# Patient Record
Sex: Female | Born: 1960 | Race: White | Hispanic: No | Marital: Married | Smoking: Never smoker
Health system: Southern US, Community
[De-identification: ages and names within clinical notes are randomized; demographics above are authoritative.]

## PROBLEM LIST (undated history)

## (undated) DIAGNOSIS — D333 Benign neoplasm of cranial nerves: Secondary | ICD-10-CM

## (undated) DIAGNOSIS — E079 Disorder of thyroid, unspecified: Secondary | ICD-10-CM

## (undated) DIAGNOSIS — G44009 Cluster headache syndrome, unspecified, not intractable: Secondary | ICD-10-CM

## (undated) HISTORY — PX: BRAIN SURGERY: SHX531

## (undated) HISTORY — PX: ABDOMINAL HYSTERECTOMY: SHX81

## (undated) HISTORY — PX: THYROIDECTOMY: SHX17

## (undated) HISTORY — PX: CHOLECYSTECTOMY: SHX55

---

## 2000-04-30 ENCOUNTER — Other Ambulatory Visit: Admission: RE | Admit: 2000-04-30 | Discharge: 2000-04-30 | Payer: Self-pay | Admitting: *Deleted

## 2000-05-02 ENCOUNTER — Encounter (INDEPENDENT_AMBULATORY_CARE_PROVIDER_SITE_OTHER): Payer: Self-pay

## 2000-05-02 ENCOUNTER — Other Ambulatory Visit: Admission: RE | Admit: 2000-05-02 | Discharge: 2000-05-02 | Payer: Self-pay | Admitting: *Deleted

## 2004-07-26 ENCOUNTER — Ambulatory Visit (HOSPITAL_COMMUNITY): Admission: RE | Admit: 2004-07-26 | Discharge: 2004-07-26 | Payer: Self-pay | Admitting: Obstetrics & Gynecology

## 2004-09-20 ENCOUNTER — Encounter (INDEPENDENT_AMBULATORY_CARE_PROVIDER_SITE_OTHER): Payer: Self-pay | Admitting: *Deleted

## 2004-09-20 ENCOUNTER — Ambulatory Visit (HOSPITAL_COMMUNITY): Admission: RE | Admit: 2004-09-20 | Discharge: 2004-09-20 | Payer: Self-pay | Admitting: Obstetrics & Gynecology

## 2005-10-09 ENCOUNTER — Ambulatory Visit (HOSPITAL_COMMUNITY): Admission: RE | Admit: 2005-10-09 | Discharge: 2005-10-09 | Payer: Self-pay | Admitting: Obstetrics & Gynecology

## 2006-02-12 ENCOUNTER — Ambulatory Visit (HOSPITAL_COMMUNITY): Admission: RE | Admit: 2006-02-12 | Discharge: 2006-02-13 | Payer: Self-pay | Admitting: Obstetrics & Gynecology

## 2006-02-12 ENCOUNTER — Encounter (INDEPENDENT_AMBULATORY_CARE_PROVIDER_SITE_OTHER): Payer: Self-pay | Admitting: *Deleted

## 2006-10-23 ENCOUNTER — Encounter: Admission: RE | Admit: 2006-10-23 | Discharge: 2006-10-23 | Payer: Self-pay | Admitting: Family Medicine

## 2006-11-07 ENCOUNTER — Encounter: Admission: RE | Admit: 2006-11-07 | Discharge: 2006-11-07 | Payer: Self-pay | Admitting: Family Medicine

## 2006-11-20 ENCOUNTER — Encounter: Admission: RE | Admit: 2006-11-20 | Discharge: 2006-11-20 | Payer: Self-pay | Admitting: Family Medicine

## 2006-11-20 ENCOUNTER — Encounter (INDEPENDENT_AMBULATORY_CARE_PROVIDER_SITE_OTHER): Payer: Self-pay | Admitting: Interventional Radiology

## 2006-11-20 ENCOUNTER — Other Ambulatory Visit: Admission: RE | Admit: 2006-11-20 | Discharge: 2006-11-20 | Payer: Self-pay | Admitting: Interventional Radiology

## 2006-11-29 ENCOUNTER — Ambulatory Visit (HOSPITAL_COMMUNITY): Admission: RE | Admit: 2006-11-29 | Discharge: 2006-11-29 | Payer: Self-pay | Admitting: Family Medicine

## 2007-01-30 ENCOUNTER — Encounter: Admission: RE | Admit: 2007-01-30 | Discharge: 2007-01-30 | Payer: Self-pay | Admitting: Endocrinology

## 2007-04-17 ENCOUNTER — Encounter (HOSPITAL_BASED_OUTPATIENT_CLINIC_OR_DEPARTMENT_OTHER): Payer: Self-pay | Admitting: General Surgery

## 2007-04-17 ENCOUNTER — Ambulatory Visit (HOSPITAL_COMMUNITY): Admission: RE | Admit: 2007-04-17 | Discharge: 2007-04-18 | Payer: Self-pay | Admitting: General Surgery

## 2008-02-14 ENCOUNTER — Ambulatory Visit (HOSPITAL_COMMUNITY): Admission: RE | Admit: 2008-02-14 | Discharge: 2008-02-14 | Payer: Self-pay | Admitting: Family Medicine

## 2008-06-29 IMAGING — US US BIOPSY
1 series · 13 of 13 positions shown · non-contrast
Comparison: 1.

CLINICAL DATA: Multinodular thyroid goiter with focal area of relative to photopenia and left lower pole which corresponds to dominant nodule seen on thyroid ultrasound.  Fine needle aspiration has been requested.

[Series 1: us biopsy · 0.08mm/px · 13 acquisitions, 13 frames shown]
[im 1/13]
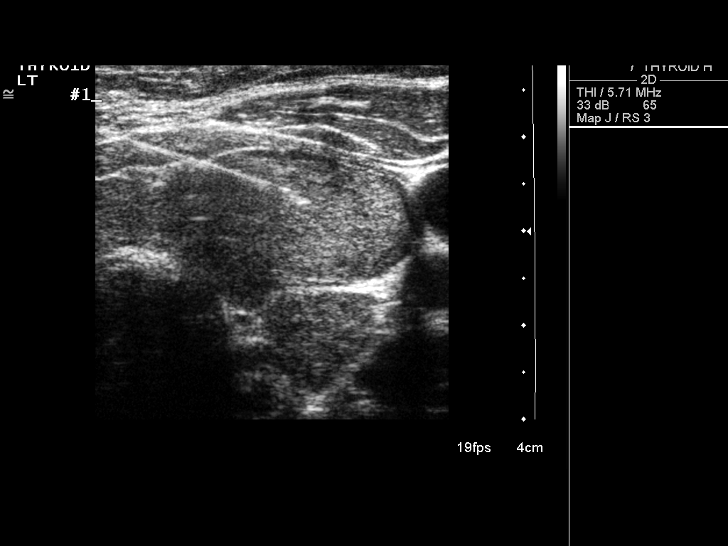
[im 2/13]
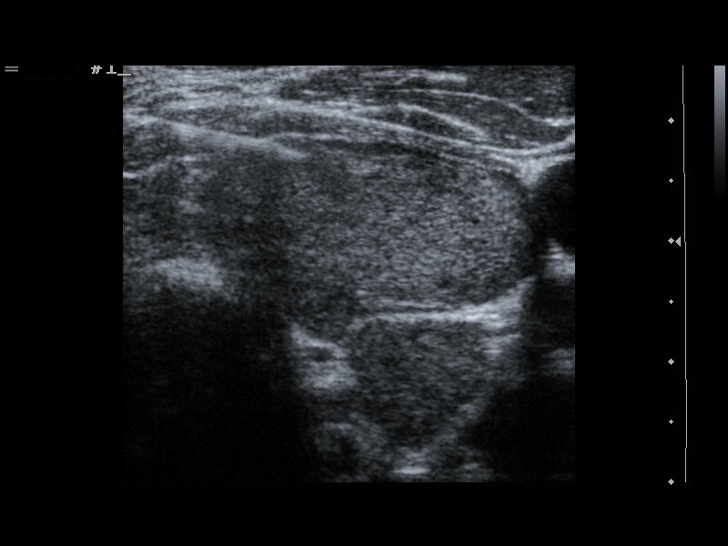
[im 3/13]
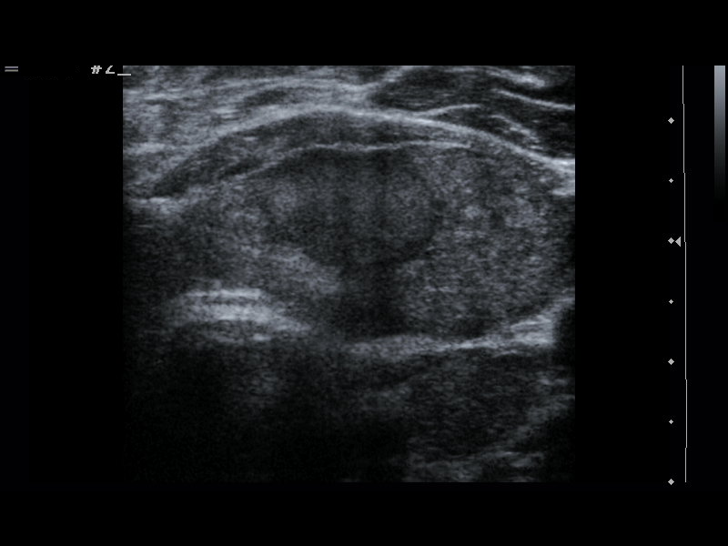
[im 4/13]
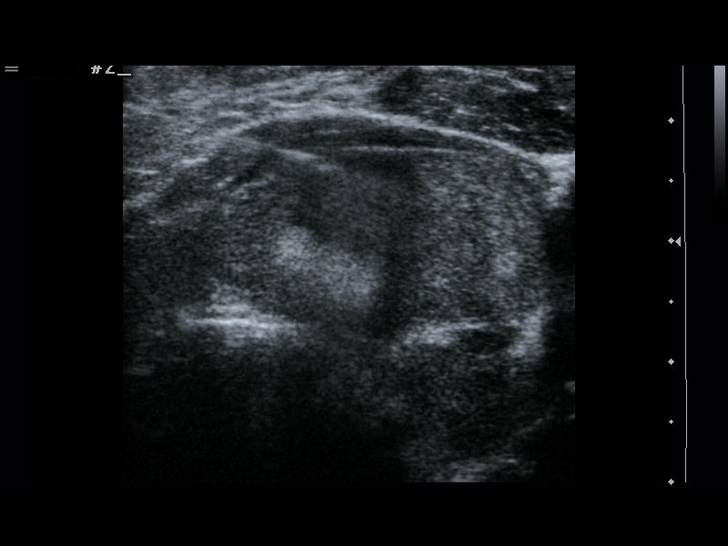
[im 5/13]
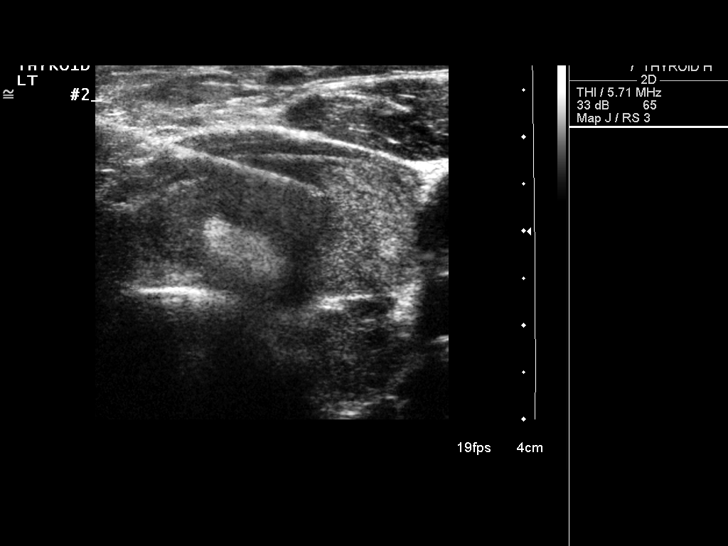
[im 6/13]
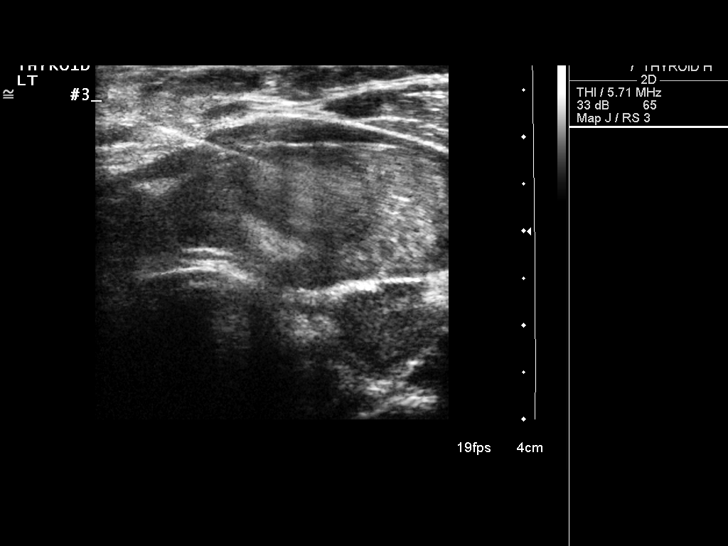
[im 7/13]
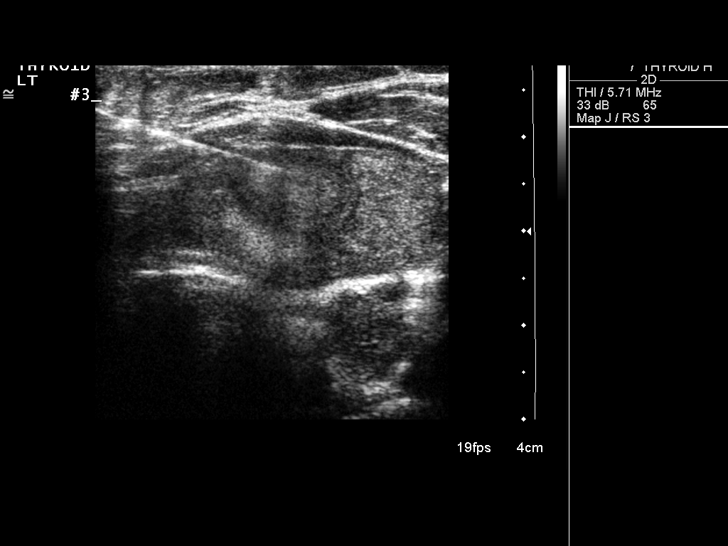
[im 8/13]
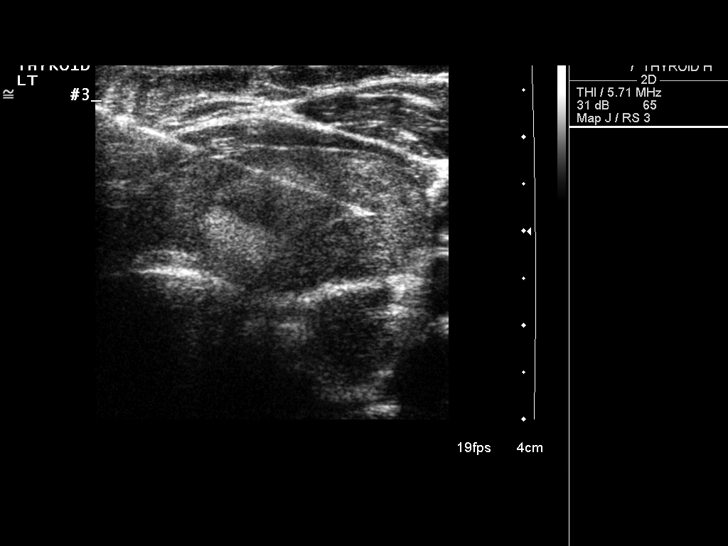
[im 9/13]
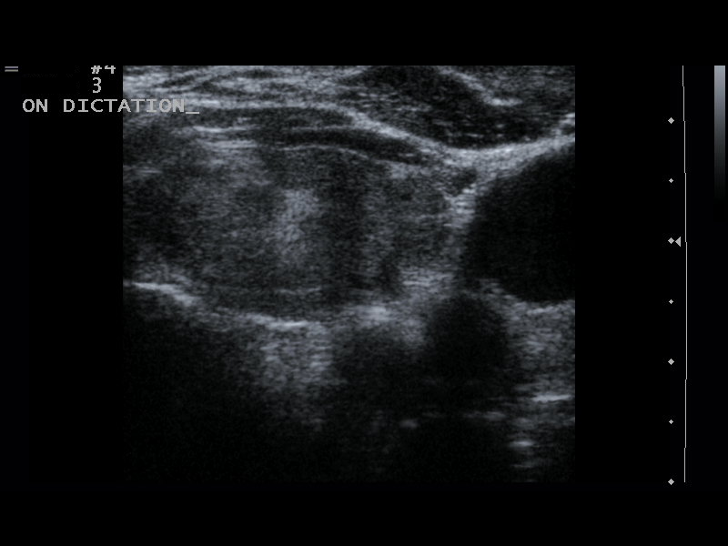
[im 10/13]
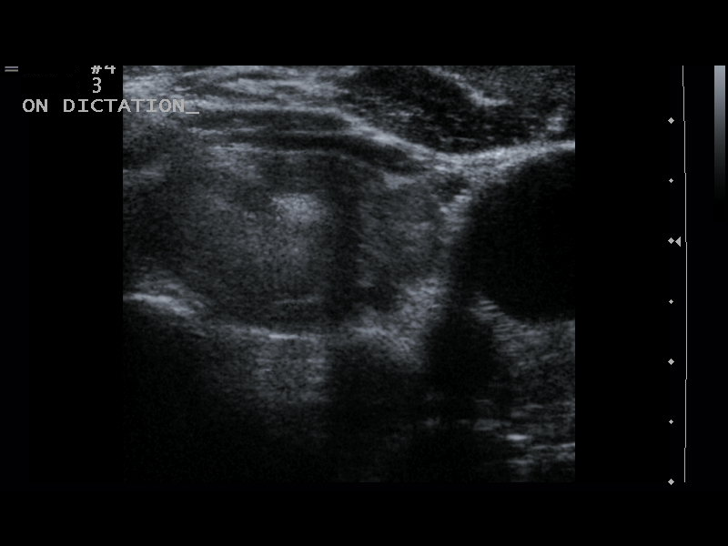
[im 11/13]
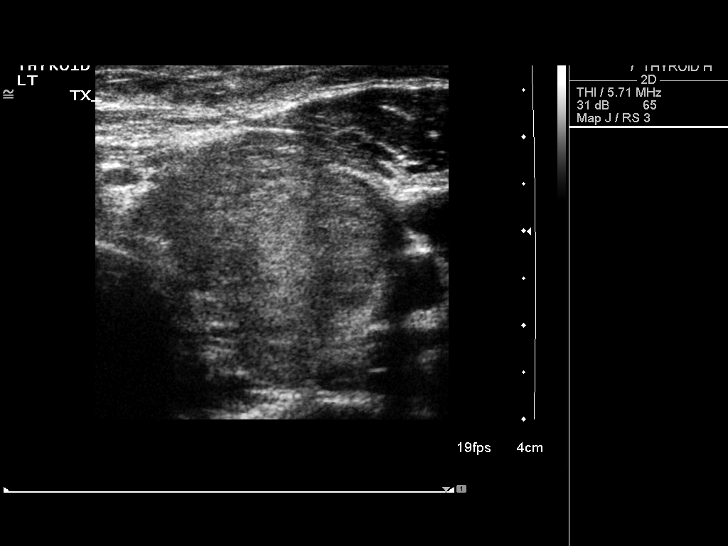
[im 12/13]
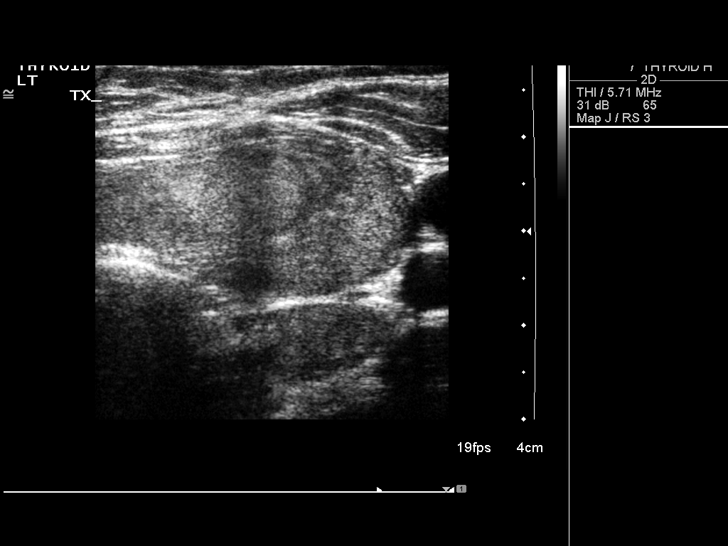
[im 13/13]
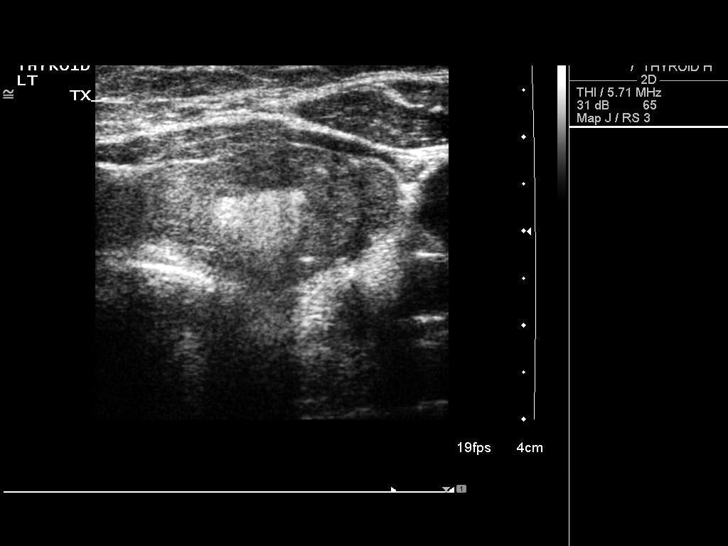

[13 of 13 positions shown; findings below may reference images not displayed]

Thyroid ultrasound, 10/23/06 ? [HOSPITAL].
2.  Nuclear medicine thyroid scan and thyroid radioiodine uptake, 11/08/06 ? [HOSPITAL].
ULTRASOUND-GUIDED FINE NEEDLE ASPIRATION, LEFT LOBE OF THYROID:
FINDINGS: The above procedure was thoroughly discussed with the patient and written informed consent was obtained. 
Ultrasound was then performed to localize and mark an adequate site for the biopsy.  The patient was then prepped and draped in a normal sterile fashion.  1% lidocaine was used for local anesthesia.  Within the left lower aspect of the thyroid, there appeared to be multiple nodules and therefore, I elected to biopsy all three areas within the left lobe of the thyroid.  A total of four passes (two were performed on one nodule)were made using a 25 gauge hypodermic needle.  Ultrasound confirmed placement of the exact location of the needle in relationship to the multiple nodules present.  These areas were also specifically marked 1, 2, and 3 on the slides sent to Pathology for further evaluation.  
Post procedure imaging demonstrated no hematoma or immediate complication.  The patient tolerated the procedure well.
IMPRESSION: Successful ultrasound-guided fine needle aspiration of several areas within the inferior aspect of the left lobe of the thyroid.  Please see full dictation above.  Final pathology pending.

## 2010-07-12 NOTE — Op Note (Signed)
Deanna Nichols, Deanna Nichols                ACCOUNT NO.:  [id number]   MEDICAL RECORD NO.:  [id number]          PATIENT TYPE:  OIB   LOCATION:  5125                         FACILITY:  [facility]   PHYSICIAN:  Leonie Man, M.D.   DATE OF BIRTH:  [birth year]   DATE OF PROCEDURE:  04/17/2007  DATE OF DISCHARGE:                               OPERATIVE REPORT   PREOPERATIVE DIAGNOSIS:  1. Multinodular goiter with compressive symptoms.  2. 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   POSTOPERATIVE DIAGNOSES:  1. Multinodular goiter with compressive symptoms.  2. 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   PROCEDURE:  Total thyroidectomy.   SURGEON:  Leonie Man, M.D.   ASSISTANT:  Anselm Pancoast. Zachery Dakins, M.D.   ANESTHESIA:  General.   BRIEF HISTORY:  Deanna Nichols is a 50 year old lady with compressive symptoms due to  an enlarging multinodular goiter despite maximum suppression therapy.  Her most recent scan showed a photo-opaque area in the left lower pole  which, on biopsy, shows Hurthle cells.  The right lobe measurement is  6.2 cm on ultrasound and the left lobe is 5.7 cm.  The patient comes to  the operating room after the risks and potential benefits of surgery are  fully discussed including permanent hypoparathyroidism as well as  recurrent laryngeal nerve injury.  She understands and gives her consent  for surgery.   PROCEDURE IN DETAIL:  The patient was positioned supinely with the neck  mildly hyperextended and the neck is then prepped and draped to be  included in the sterile operative field.  A collar incision is carried  down approximately two fingerbreadths above the sternal notch and  deepened through the skin, subcutaneous tissue and across the platysma  muscles.  A superior myocutaneous flap was raised up the thyroid  cartilage and inferiorly down to the sternal notch.  Strap muscles are  opened in their midline and carried down to the thyroid and dissection  was  carried down on the left side, carrying the strap muscles laterally  and mobilizing the thyroid from the neck.  Dissection was then carried  up into the region of the upper pole.  The upper pole vessels are  isolated and secured with 2-0 silk ties, clipped and transected.  The  upper pole is freed and the thyroid is then freed down to the lower  pole.  Both the upper and lower parathyroid glands on the left side are  identified as well as the recurrent laryngeal nerve.  These structures  were protected throughout the course of dissection.  The thyroid was  dissected free from the neck carrying the dissection from lateral to  medial.  At the medial aspect of the neck, the thyroid isthmus was  dissected free from the trachea and carried over toward the left side  just left of the midline.  The thyroid vessels were also transected  using the harmonic scalpel.  Attention was then turned to the right side  of the neck where, similarly, the strap muscles were retracted laterally  and dissection carried down, mobilizing the thyroid from  the from the  right lateral aspect of the neck.  The dissection was carried up into  the upper pole and allowed isolation of the upper pole vessels and these  were tied with 2-0 silk ties and secured with a medium clip and  transected with a harmonic scalpel.  The entire upper pole was then  freed. On the right side, there was significant dissection carried out  around the region of the ligament of Allyson Sabal because of a very large  tubercle of Zuckerkandl in this area.  The lower pole parathyroid gland  was identified and protected.  The recurrent laryngeal nerve was seen to  pass right through the ligament of Berry. Therefore, a small nodule of  thyroid gland was left over the ligament of Berry to protect the  recurrent laryngeal nerve.  The thyroid was then dissected free from the  trachea and forwarded for pathologic evaluation after a stitch had been  placed on  the right upper pole.  All areas of dissection in the neck  were checked for hemostasis.  Sponge, instrument, and sharp counts were  verified.  Gelfoam pledgets were placed in both sides of the neck for  additional hemostasis.  The midline strap muscle were then closed with  interrupted 3-0 Vicryl sutures.  The subcutaneous tissues and platysma  muscle were closed with interrupted 3-0 Vicryl sutures.  The skin was  closed with 4-0 Monocryl suture and then reinforced with Steri-Strips.  Sterile dressings were applied.  Visualization of the larynx and cords  with the assistance of anesthesia was then carried out. The cords were  noted to be quite parallel and toward the midline.  The patient was then  removed from the operating room to the recovery room in stable  condition.  She tolerated the procedure well.      Leonie Man, M.D.  Electronically Signed     PB/MEDQ  D:  04/17/2007  T:  04/17/2007  Job:  [id number]

## 2010-07-15 NOTE — Op Note (Signed)
NAMEJONESHA, Deanna Nichols                ACCOUNT NO.:  [id number]   MEDICAL RECORD NO.:  [id number]          PATIENT TYPE:  AMB   LOCATION:  SDC                           FACILITY:  [facility]   PHYSICIAN:  Genia Del, M.D.DATE OF BIRTH:  [birth year]   DATE OF PROCEDURE:  09/20/2004  DATE OF DISCHARGE:                                 OPERATIVE REPORT   PREOPERATIVE DIAGNOSIS:  Menometrorrhagia with two intrauterine lesions.   POSTOPERATIVE DIAGNOSIS:  Menometrorrhagia with two intrauterine lesions,  probable submucosal myoma and polyp.   OPERATION/PROCEDURE:  Hysteroscopy resection and dilatation and curettage.   SURGEON:  Genia Del, M.D.   ANESTHESIA:  Quillian Quince, M.D.   DESCRIPTION OF PROCEDURE:  Under general anesthesia with endotracheal  intubation, the patient is in the lithotomy position.  She is prepped with  Betadine on the suprapubic, vulvar and vaginal areas and draped as usual.  Vaginal exam reveals an anteverted uterus, normal volume.  No bile, no  adnexal masses.  The speculum is inserted in the vagina.  The anterior lip  of the cervix grasped with a tenaculum.  A paracervical block is done with  Nesacaine 1%, a total of 20 mL at 4 o'clock and 8 o'clock.  We then proceed  with dilatation of the cervix with Hegar dilator up to#35 without  difficulty.  We then insert the operative hysteroscope. The intrauterine  cavity is well-visualized.  The two ostia are well seen and pictures are  taken.  Two lesions are present from the posterior wall of the uterus.  One  appears to be a small polyp, the other one a small submucosal myoma. We use  a double-loop instrument to resect the two lesions and we sent them together  to pathology.  We then proceed with and endometrial curettage with a sharp  curet, systematically curetting all the intrauterine cavity and we send that  specimen separately to pathology.  We then visualize the intrauterine cavity  again.  No lesion is  present.  Hemostasis is good.  We take pictures after  the lesions were removed and remove all instruments.  Hemostasis is  adequate.  The fluid deficit was maximum 40 mL.  Estimated blood loss was  minimal.  No complications occurred and the patient was transferred to the  recovery room in good status.       ML/MEDQ  D:  09/20/2004  T:  09/20/2004  Job:  [id number]

## 2010-07-15 NOTE — Op Note (Signed)
Deanna Nichols, Deanna Nichols                ACCOUNT NO.:  [id number]   MEDICAL RECORD NO.:  [id number]          PATIENT TYPE:  OIB   LOCATION:  1306                         FACILITY:  [facility]   PHYSICIAN:  Genia Del, M.D.DATE OF BIRTH:  Jun 14, 1960   DATE OF PROCEDURE:  02/12/2006  DATE OF DISCHARGE:                               OPERATIVE REPORT   PREOPERATIVE DIAGNOSIS:  Menometrorrhagia with uterine myomas.   POSTOPERATIVE DIAGNOSIS:  1. Menometrorrhagia with uterine myomas.  2. Accidental cystotomy.   PROCEDURE:  A da Vinci assisted laparoscopic hysterectomy with bilateral  salpingo-oophorectomy, and repair of the bladder by laparoscopy assisted  with [employer].  Diagnostic cystoscopy.   SURGEON:  Jerral Ralph, M.D.   ASSISTANT:  Richardean Sale, M.D.   PROCEDURE:  Under general anesthesia with endotracheal intubation, the  patient is in the lithotomy position.  She is prepped with Betadine in  the abdominal suprapubic vulvar and vaginal areas; and draped as usual,  a bladder catheter is inserted.  The Kho ring with the Roomy are put in  place vaginally, as stitches done on the cervix to attach to the Kho  ring to improve stability of the instrument.  We then go abdominally.  We measure the camera incision at 22-cm above the symphysis pubis just  above the umbilicus.  We inject Marcaine 1/4 plain and then make a 1.5  cm incision at that level.  We opened layer by layer the aponeurosis is  opened with Mayo scissors and the parietal peritoneum is opened bluntly  with the finger.  We make a pursestring stitch of Vicryl #0 at the  aponeurosis.  The Hasson with the camera are inserted at that level.  The abdominopelvic cavities are inspected.  No adhesion is present with  the anterior wall.  We, therefore, made our measurements for the other  ports.  We insert three robotic trocars and one assistant trocars.  We  then docked the robot and put the instruments in place.  We used  the  fenestrated bipolar and the shear scissors as well as the Cobra probe  clamp in the third robotic arm.   We proceed with the hysterectomy.  We cauterize and section successively  the left round ligament.  We then verified the position of the left  ureter which is in normal anatomic position.  We cauterized and  sectioned the left infundibulopelvic ligament and then followed on the  left lateral side of the uterus until we reached the left uterine  artery.  We opened the visceral peritoneum anteriorly, and reclined the  bladder downward.  We identified the left uterine artery.  We cauterized  and sectioned it.  We then proceed exactly the same way on the right  side.  We then push a co-ring further and reclined the bladder further  in order to then proceed with the opening of the vaginal vault all  around the co-ring.  As we were reclining the bladder downward in the  midline, a small accidental cystotomy occurred.  It was recognized right  away, and repaired by laparoscopy with the assistance of  the bed [employer] robot.  We used Vicryl 2-0 and made about five interrupted  stitches.  At first the cystotomy was very very small, but as we were  repairing, it tore a little bit further so the final size of the  cystotomy was about 2.5 cm.  We then injected methylene blue inside the  bladder and noted no leakage at the level of the repaired cystotomy.   We then proceed the surgery after further reclining the bladder  downward, starting very very high on the uterus.  We opened the  circumferential incision with the point of the shear scissors all  around, and detached the uterus that way.  We passed above the  uterosacral ligaments so that support to the vagina would be preserved.  We then noted that the uterus was too voluminous to be brought out  through the vagina intact.  Therefore, a myomectomy was done at the  level of the largest myoma at the left fundus.  After removing the   myoma, we were able to remove the uterus; and then the myoma came out in  one piece through the vagina.  Both specimens were sent to pathology.  We then put the obturator in the vagina and closed the vaginal vault by  figure-of-eight Vicryl #0 sutures.  We verified hemostasis at all  levels; and it was adequate.  We irrigated and suctioned the  abdominopelvic cavity.   All instruments were removed the CO2 was evacuated.  The supraumbilical  incision was closed at the aponeurosis by attaching the suture.  We then  closed the skin with a subcuticular suture of Vicryl 4-0.  We closed the  assistant port with a Vicryl 4-0 as well and the other incisions were  closed with Dermabond.  We then proceeded with a diagnostic cystoscopy.  We used the 12-degree first.  The repaired cystostomy was well  visualized; and was intact.  After giving indigo carmine IV with the 70-  degree cystoscope we were able to visualize both ureters entering the  bladder; and the indigo carmine jets were seen.  We, therefore, removed  the cystoscope, emptied the bladder and put a Foley catheter in place to  straight drain. The estimated blood loss was 650 mL.  The counts of  sponges and instruments was complete.  The patient was brought to  recovery room in good stable status.  She will receive Septra DS b.i.d.  until the bladder catheter is removed.  The bladder catheter will be  removed and 5-7 days after the surgery.      Genia Del, M.D.  Electronically Signed     ML/MEDQ  D:  02/12/2006  T:  02/12/2006  Job:  [id number]

## 2010-07-18 ENCOUNTER — Other Ambulatory Visit (HOSPITAL_COMMUNITY): Payer: Self-pay | Admitting: Family Medicine

## 2010-07-18 DIAGNOSIS — Z1231 Encounter for screening mammogram for malignant neoplasm of breast: Secondary | ICD-10-CM

## 2010-07-20 ENCOUNTER — Ambulatory Visit (HOSPITAL_COMMUNITY)
Admission: RE | Admit: 2010-07-20 | Discharge: 2010-07-20 | Disposition: A | Discharge status: Home / Self Care | Payer: Self-pay | Source: Ambulatory Visit | Attending: Family Medicine | Admitting: Family Medicine

## 2010-07-20 DIAGNOSIS — Z1231 Encounter for screening mammogram for malignant neoplasm of breast: Secondary | ICD-10-CM

## 2010-11-18 LAB — CBC
HCT: 41.9
MCHC: 34.9
MCV: 87.8
Platelets: 327
WBC: 7.7

## 2010-11-18 LAB — DIFFERENTIAL
Basophils Relative: 1
Eosinophils Absolute: 0.2
Eosinophils Relative: 3
Lymphs Abs: 2.1
Monocytes Relative: 7
Neutrophils Relative %: 62

## 2010-11-18 LAB — PROTIME-INR
INR: 0.9
Prothrombin Time: 12.6

## 2010-11-18 LAB — BASIC METABOLIC PANEL
BUN: 10
CO2: 28
Chloride: 105
Creatinine, Ser: 0.88
Potassium: 4

## 2010-11-18 LAB — CALCIUM: Calcium: 8.4

## 2011-03-09 ENCOUNTER — Other Ambulatory Visit: Payer: Self-pay | Admitting: Gastroenterology

## 2012-10-31 ENCOUNTER — Other Ambulatory Visit: Payer: Self-pay | Admitting: Otolaryngology

## 2012-10-31 DIAGNOSIS — H9122 Sudden idiopathic hearing loss, left ear: Secondary | ICD-10-CM

## 2012-11-04 ENCOUNTER — Ambulatory Visit
Admission: RE | Admit: 2012-11-04 | Discharge: 2012-11-04 | Disposition: A | Discharge status: Home / Self Care | Payer: 59 | Source: Ambulatory Visit | Attending: Otolaryngology | Admitting: Otolaryngology

## 2012-11-04 DIAGNOSIS — H9122 Sudden idiopathic hearing loss, left ear: Secondary | ICD-10-CM

## 2012-11-04 MED ORDER — GADOBENATE DIMEGLUMINE 529 MG/ML IV SOLN: 20.0000 mL | Freq: Once | INTRAVENOUS | Status: AC | PRN

## 2012-11-05 ENCOUNTER — Other Ambulatory Visit: Payer: 59

## 2012-11-05 ENCOUNTER — Other Ambulatory Visit: Payer: Self-pay

## 2012-11-07 ENCOUNTER — Ambulatory Visit
Admission: RE | Admit: 2012-11-07 | Discharge: 2012-11-07 | Disposition: A | Discharge status: Home / Self Care | Payer: 59 | Source: Ambulatory Visit | Attending: Otolaryngology | Admitting: Otolaryngology

## 2012-11-07 MED ORDER — GADOBENATE DIMEGLUMINE 529 MG/ML IV SOLN
20.0000 mL | Freq: Once | INTRAVENOUS | Status: AC | PRN
  Administered 2012-11-07: 20 mL via INTRAVENOUS

## 2013-01-02 ENCOUNTER — Other Ambulatory Visit: Payer: Self-pay

## 2014-09-24 ENCOUNTER — Other Ambulatory Visit (HOSPITAL_COMMUNITY): Payer: Self-pay | Admitting: Family Medicine

## 2014-09-24 DIAGNOSIS — Z1231 Encounter for screening mammogram for malignant neoplasm of breast: Secondary | ICD-10-CM

## 2014-09-29 ENCOUNTER — Ambulatory Visit (HOSPITAL_COMMUNITY)
Admission: RE | Admit: 2014-09-29 | Discharge: 2014-09-29 | Disposition: A | Discharge status: Home / Self Care | Payer: 59 | Source: Ambulatory Visit | Attending: Family Medicine | Admitting: Family Medicine

## 2014-09-29 DIAGNOSIS — Z1231 Encounter for screening mammogram for malignant neoplasm of breast: Secondary | ICD-10-CM | POA: Diagnosis not present

## 2014-10-01 ENCOUNTER — Other Ambulatory Visit: Payer: Self-pay | Admitting: Family Medicine

## 2014-10-01 DIAGNOSIS — R928 Other abnormal and inconclusive findings on diagnostic imaging of breast: Secondary | ICD-10-CM

## 2014-10-07 ENCOUNTER — Other Ambulatory Visit: Payer: 59

## 2014-10-07 ENCOUNTER — Ambulatory Visit
Admission: RE | Admit: 2014-10-07 | Discharge: 2014-10-07 | Disposition: A | Discharge status: Home / Self Care | Payer: 59 | Source: Ambulatory Visit | Attending: Family Medicine | Admitting: Family Medicine

## 2014-10-07 DIAGNOSIS — R928 Other abnormal and inconclusive findings on diagnostic imaging of breast: Secondary | ICD-10-CM

## 2014-10-19 ENCOUNTER — Other Ambulatory Visit: Payer: Self-pay | Admitting: Gastroenterology

## 2015-02-27 ENCOUNTER — Encounter: Payer: Self-pay | Admitting: Emergency Medicine

## 2015-02-27 ENCOUNTER — Emergency Department (INDEPENDENT_AMBULATORY_CARE_PROVIDER_SITE_OTHER)
Admission: EM | Admit: 2015-02-27 | Discharge: 2015-02-27 | Disposition: A | Discharge status: Home / Self Care | Payer: 59 | Source: Home / Self Care | Attending: Family Medicine | Admitting: Family Medicine

## 2015-02-27 DIAGNOSIS — J069 Acute upper respiratory infection, unspecified: Secondary | ICD-10-CM

## 2015-02-27 DIAGNOSIS — J019 Acute sinusitis, unspecified: Secondary | ICD-10-CM | POA: Diagnosis not present

## 2015-02-27 DIAGNOSIS — H6692 Otitis media, unspecified, left ear: Secondary | ICD-10-CM | POA: Diagnosis not present

## 2015-02-27 HISTORY — DX: Disorder of thyroid, unspecified: E07.9

## 2015-02-27 HISTORY — DX: Benign neoplasm of cranial nerves: D33.3

## 2015-02-27 HISTORY — DX: Cluster headache syndrome, unspecified, not intractable: G44.009

## 2015-02-27 MED ORDER — AMOXICILLIN-POT CLAVULANATE 875-125 MG PO TABS: 1.0000 | ORAL_TABLET | Freq: Two times a day (BID) | ORAL | Status: AC

## 2015-02-27 NOTE — ED Provider Notes (Signed)
CSN: [id number]     Arrival date & time 02/27/15  1107 History   First MD Initiated Contact with Patient 02/27/15 1134     Chief Complaint  Patient presents with  . Nasal Congestion  . Otalgia   (Consider location/radiation/quality/duration/timing/severity/associated sxs/prior Treatment) HPI Pt is a 54yo female presenting to [facility] with c/o worsening sinus congestion with facial pain, bilateral ear pain that is worse in Left ear, and bilateral eye redness and itching that started 2 days ago. Left ear pain is 3/10 at this time.  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  She has also had subjective fever with hot and cold chills. Denies n/v/d.  She did take tylenol around 0830 this morning with minimal relief.  She has also been taking sudafed with minimal relief. No sick contacts or recent travel.   Past Medical History  Diagnosis Date  . 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 Thyroid disease   . Cluster headache    Past Surgical History  Procedure Laterality Date  . Thyroidectomy    . Cholecystectomy    . Abdominal hysterectomy    . Brain surgery     Family History  Problem Relation Age of Onset  . Hypertension Mother   . Stroke Father    Social History  Substance Use Topics  . Smoking status: Never Smoker   . Smokeless tobacco: None  . Alcohol Use: No   OB History    No data available     Review of Systems  Constitutional: Positive for fever, chills and fatigue.  HENT: Positive for congestion, ear pain, rhinorrhea, sinus pressure and sneezing. Negative for ear discharge, sore throat and voice change.   Eyes: Positive for pain, discharge ( watery), redness and itching. Negative for photophobia and visual disturbance.  Respiratory: Positive for cough. Negative for shortness of breath and wheezing.   Gastrointestinal: Negative for nausea, vomiting, abdominal pain and diarrhea.  Musculoskeletal: Negative  for myalgias, arthralgias, neck pain and neck stiffness.  Neurological: Positive for headaches. Negative for dizziness and light-headedness.    Allergies  Review of patient's allergies indicates no known allergies.  Home Medications   Prior to Admission medications   Medication Sig Start Date End Date Taking? Authorizing Provider  allopurinol (ZYLOPRIM) 300 MG tablet Take 300 mg by mouth daily.   Yes Historical Provider, MD  colchicine 0.6 MG tablet Take 0.6 mg by mouth daily.   Yes Historical Provider, MD  levothyroxine (SYNTHROID, LEVOTHROID) 100 MCG tablet Take 100 mcg by mouth daily before breakfast.   Yes Historical Provider, MD  SUMAtriptan (IMITREX) 100 MG tablet Take 100 mg by mouth every 2 (two) hours as needed for migraine. May repeat in 2 hours if headache persists or recurs.   Yes Historical Provider, MD  SUMAtriptan (IMITREX) 6 MG/0.5ML SOLN injection Inject 6 mg into the skin every 2 (two) hours as needed for migraine or headache. May repeat in 2 hours if headache persists or recurs.   Yes Historical Provider, MD  verapamil (CALAN) 80 MG tablet Take 80 mg by mouth 3 (three) times daily.   Yes Historical Provider, MD  amoxicillin-clavulanate (AUGMENTIN) 875-125 MG tablet Take 1 tablet by mouth 2 (two) times daily. For 10 days 02/27/15   Noland Fordyce, PA-C   Meds Ordered and Administered this Visit  Medications - No data to display  BP 138/84 mmHg  Pulse 122  Temp(Src)  97.7 F (36.5 C) (Oral)  Resp 16  Ht 5\' 6"  (1.676 m)  Wt 240 lb (108.863 kg)  BMI 38.76 kg/m2  SpO2 95% No data found.   Physical Exam  Constitutional: She appears well-developed and well-nourished. No distress.  Pt sitting on exam table, appears mildly fatigued, but no acute distress.  HENT:  Head: Normocephalic and atraumatic.  Right Ear: Hearing, tympanic membrane, external ear and ear canal normal.  Left Ear: Hearing, external ear and ear canal normal. No mastoid tenderness. Tympanic membrane is  erythematous and bulging.  Nose: Mucosal edema present. Right sinus exhibits maxillary sinus tenderness and frontal sinus tenderness. Left sinus exhibits maxillary sinus tenderness and frontal sinus tenderness.  Mouth/Throat: Uvula is midline, oropharynx is clear and moist and mucous membranes are normal.  Eyes: Conjunctivae are normal. No scleral icterus.  Neck: Normal range of motion. Neck supple.  Cardiovascular: Regular rhythm and normal heart sounds.  Tachycardia present.   Pulmonary/Chest: Effort normal and breath sounds normal. No respiratory distress. She has no wheezes. She has no rales. She exhibits no tenderness.  Abdominal: Soft. She exhibits no distension. There is no tenderness. There is no rebound.  Musculoskeletal: Normal range of motion.  Neurological: She is alert.  Skin: Skin is warm and dry. She is not diaphoretic.  Nursing note and vitals reviewed.   ED Course  Procedures (including critical care time)  Labs Review Labs Reviewed - No data to display  Imaging Review No results found.   MDM   1. Acute rhinosinusitis   2. Acute upper respiratory infection   3. Recurrent acute otitis media of left ear, unspecified otitis media type    Pt is a 54yo female c/o suddenly worsening sinus congestion and sinus pain with Left ear pain. Hx of tumor of Left middle ear.   Pt is tachycardic with HR of 122, but denies chest pain or SOB. Pt is afebrile per temp, however, pt is warm to touch, could be tachyardic from pain and not feeling well. No evidence of acute distress.  Will start pt on Augmentin to cover for bacterial cause given pt's hx of recent radiation for inner ear tumor.  May take acetaminophen and ibuprofen for fever or pain.  F/u with PCP in 7-10 days if not improving, sooner if worsening. Patient verbalized understanding and agreement with treatment plan.    Noland Fordyce, PA-C 02/27/15 1259

## 2015-02-27 NOTE — Discharge Instructions (Signed)
You may take 400-600mg Ibuprofen (Motrin) every 6-8 hours for fever and pain  °Alternate with Tylenol  °You may take 500mg Tylenol every 4-6 hours as needed for fever and pain  °Follow-up with your primary care provider next week for recheck of symptoms if not improving.  °Be sure to drink plenty of fluids and rest, at least 8hrs of sleep a night, preferably more while you are sick. °Return urgent care or go to closest ER if you cannot keep down fluids/signs of dehydration, fever not reducing with Tylenol, difficulty breathing/wheezing, stiff neck, worsening condition, or other concerns (see below)  °Please take antibiotics as prescribed and be sure to complete entire course even if you start to feel better to ensure infection does not come back. ° ° °Cool Mist Vaporizers °Vaporizers may help relieve the symptoms of a cough and cold. They add moisture to the air, which helps mucus to become thinner and less sticky. This makes it easier to breathe and cough up secretions. Cool mist vaporizers do not cause serious burns like hot mist vaporizers, which may also be called steamers or humidifiers. Vaporizers have not been proven to help with colds. You should not use a vaporizer if you are allergic to mold. °HOME CARE INSTRUCTIONS °· Follow the package instructions for the vaporizer. °· Do not use anything other than distilled water in the vaporizer. °· Do not run the vaporizer all of the time. This can cause mold or bacteria to grow in the vaporizer. °· Clean the vaporizer after each time it is used. °· Clean and dry the vaporizer well before storing it. °· Stop using the vaporizer if worsening respiratory symptoms develop. °  °This information is not intended to replace advice given to you by your health care provider. Make sure you discuss any questions you have with your health care provider. °  °Document Released: 11/11/2003 Document Revised: 02/18/2013 Document Reviewed: 07/03/2012 °Elsevier Interactive Patient  Education ©2016 [employer] ° °

## 2015-02-27 NOTE — ED Notes (Signed)
Reports onset of congestion, pressure across sinuses and ears and eyes with headache 2 days ago. Took tylenol at 0830 today.

## 2015-04-28 ENCOUNTER — Other Ambulatory Visit: Payer: Self-pay | Admitting: Rheumatology

## 2015-04-28 DIAGNOSIS — R945 Abnormal results of liver function studies: Principal | ICD-10-CM

## 2015-04-28 DIAGNOSIS — M109 Gout, unspecified: Secondary | ICD-10-CM

## 2015-04-28 DIAGNOSIS — Z79899 Other long term (current) drug therapy: Secondary | ICD-10-CM

## 2015-04-28 DIAGNOSIS — R7989 Other specified abnormal findings of blood chemistry: Secondary | ICD-10-CM

## 2015-05-07 ENCOUNTER — Other Ambulatory Visit: Payer: 59

## 2015-07-14 ENCOUNTER — Ambulatory Visit
Admission: RE | Admit: 2015-07-14 | Discharge: 2015-07-14 | Disposition: A | Discharge status: Home / Self Care | Payer: 59 | Source: Ambulatory Visit | Attending: Rheumatology | Admitting: Rheumatology

## 2015-07-14 DIAGNOSIS — Z79899 Other long term (current) drug therapy: Secondary | ICD-10-CM

## 2015-07-14 DIAGNOSIS — M109 Gout, unspecified: Secondary | ICD-10-CM

## 2015-07-14 DIAGNOSIS — R945 Abnormal results of liver function studies: Principal | ICD-10-CM

## 2015-07-14 DIAGNOSIS — R7989 Other specified abnormal findings of blood chemistry: Secondary | ICD-10-CM

## 2017-01-08 ENCOUNTER — Other Ambulatory Visit: Payer: Self-pay | Admitting: Family Medicine

## 2017-01-08 DIAGNOSIS — Z1231 Encounter for screening mammogram for malignant neoplasm of breast: Secondary | ICD-10-CM

## 2017-02-06 ENCOUNTER — Ambulatory Visit: Payer: 59

## 2018-07-23 ENCOUNTER — Other Ambulatory Visit: Payer: Self-pay | Admitting: Family Medicine

## 2018-07-23 DIAGNOSIS — Z1231 Encounter for screening mammogram for malignant neoplasm of breast: Secondary | ICD-10-CM

## 2018-07-24 ENCOUNTER — Other Ambulatory Visit: Payer: Self-pay | Admitting: Family Medicine

## 2018-07-24 DIAGNOSIS — N6310 Unspecified lump in the right breast, unspecified quadrant: Secondary | ICD-10-CM

## 2018-08-15 ENCOUNTER — Ambulatory Visit
Admission: RE | Admit: 2018-08-15 | Discharge: 2018-08-15 | Disposition: A | Discharge status: Home / Self Care | Payer: 59 | Source: Ambulatory Visit | Attending: Family Medicine | Admitting: Family Medicine

## 2018-08-15 ENCOUNTER — Other Ambulatory Visit: Payer: Self-pay

## 2018-08-15 ENCOUNTER — Other Ambulatory Visit: Payer: Self-pay | Admitting: Family Medicine

## 2018-08-15 DIAGNOSIS — N6310 Unspecified lump in the right breast, unspecified quadrant: Secondary | ICD-10-CM

## 2018-09-02 ENCOUNTER — Other Ambulatory Visit: Payer: Self-pay | Admitting: Family Medicine

## 2018-10-09 ENCOUNTER — Other Ambulatory Visit: Payer: 59

## 2020-03-24 IMAGING — MG DIGITAL DIAGNOSTIC BILATERAL MAMMOGRAM WITH TOMO AND CAD
6 of 10 series · 6 of 30 positions shown · non-contrast
Comparison: Previous exam(s).

CLINICAL DATA: 57-year-old female with a palpable abnormality in
the far inner right breast. The patient states she noticed this
palpable abnormality in the right breast following a mosquito bite
in subsequently approximately a month later she noticed a small lump
in this location. She feels as if this lump has decreased in size
over time.

EXAM:
DIGITAL DIAGNOSTIC BILATERAL MAMMOGRAM WITH CAD AND TOMO
RIGHT BREAST ULTRASOUND

[L CC synth-2D]
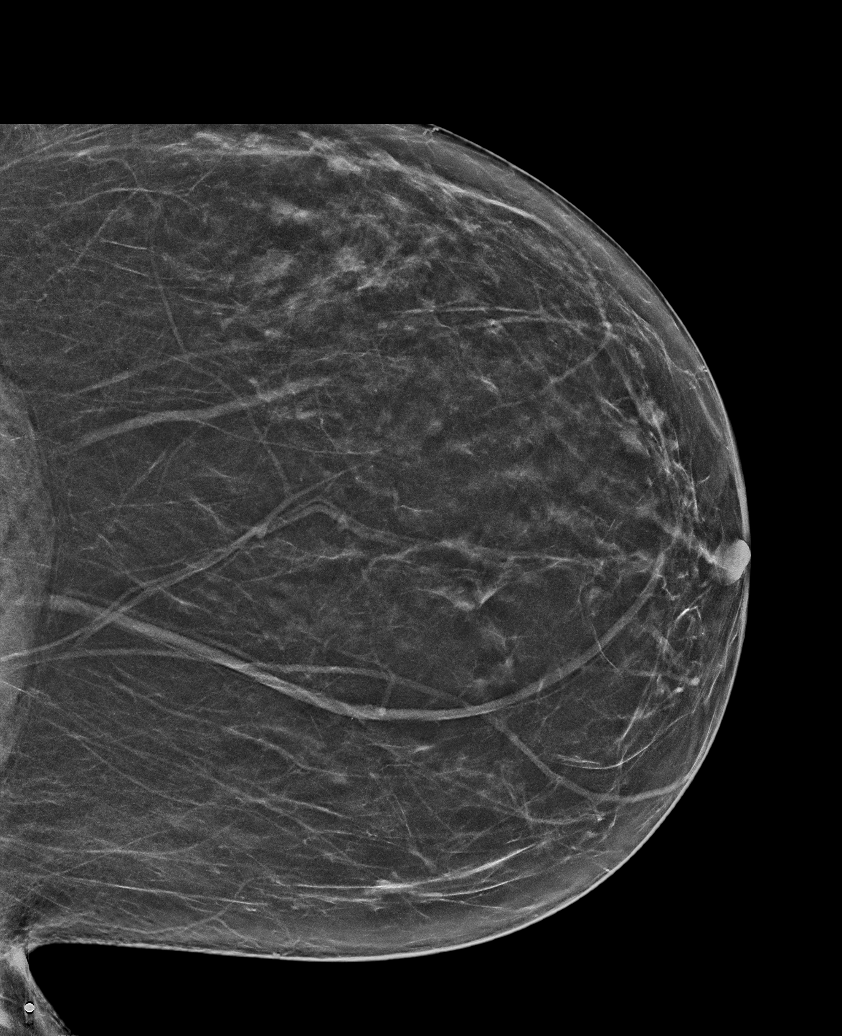

[R CC synth-2D (1 of 2)]
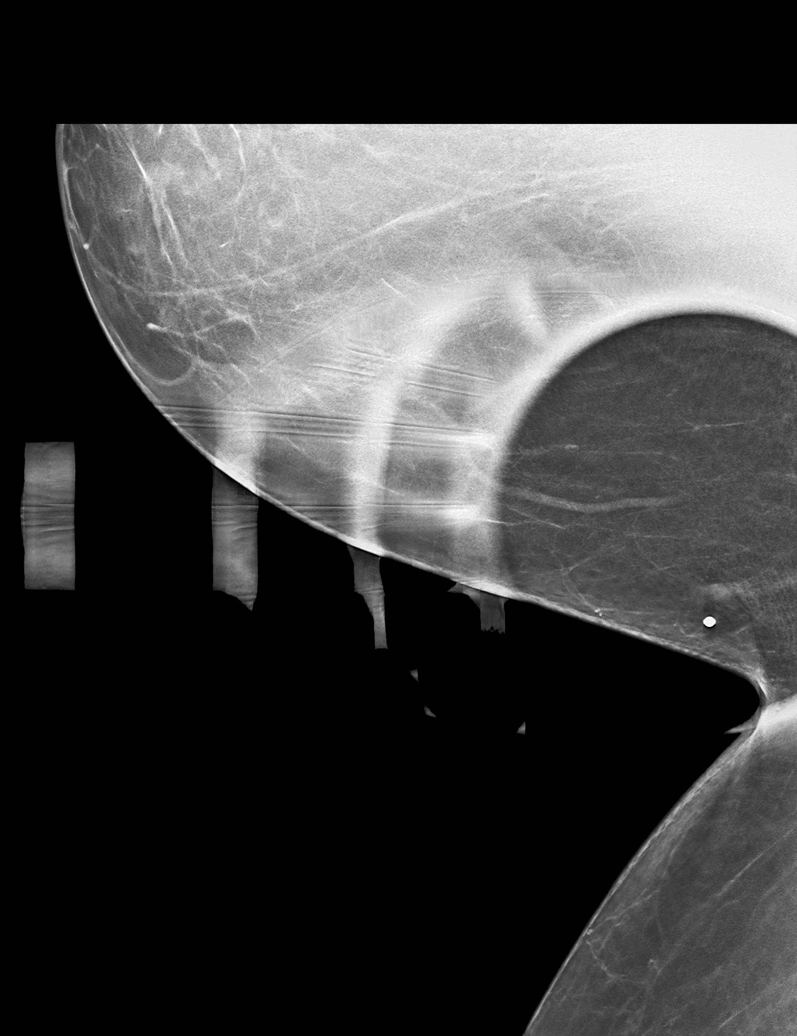

[R MLO synth-2D]
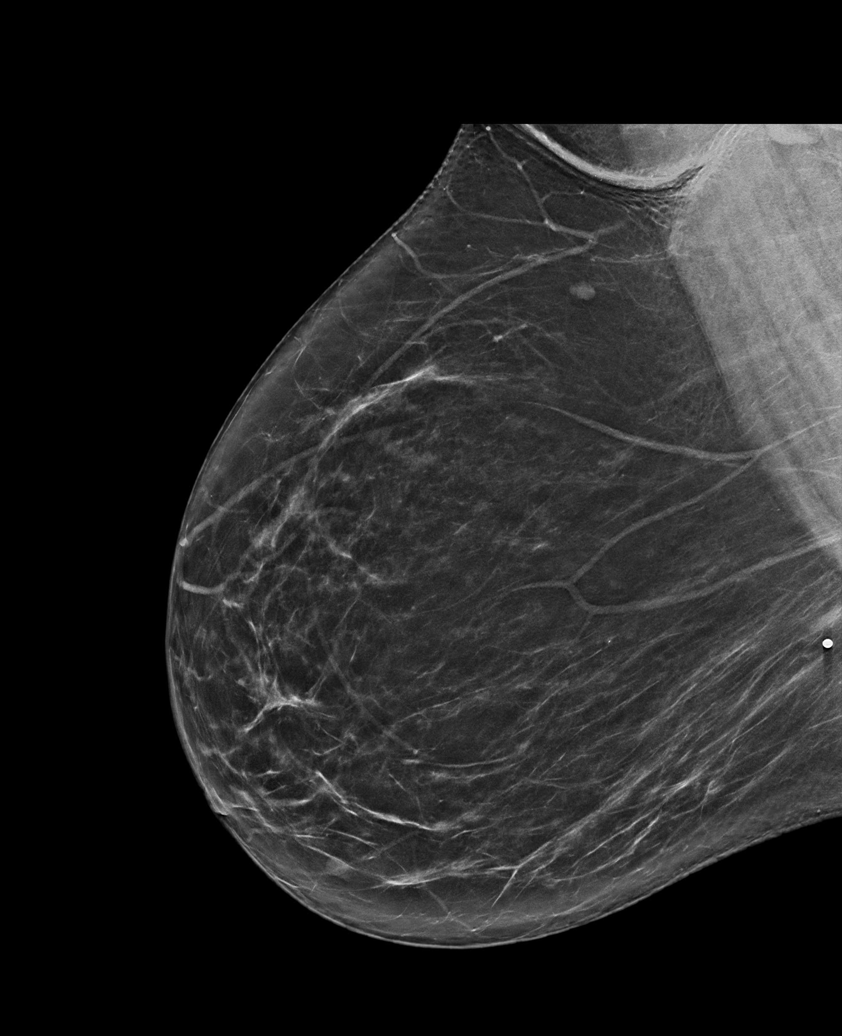

[R CC synth-2D (2 of 2)]
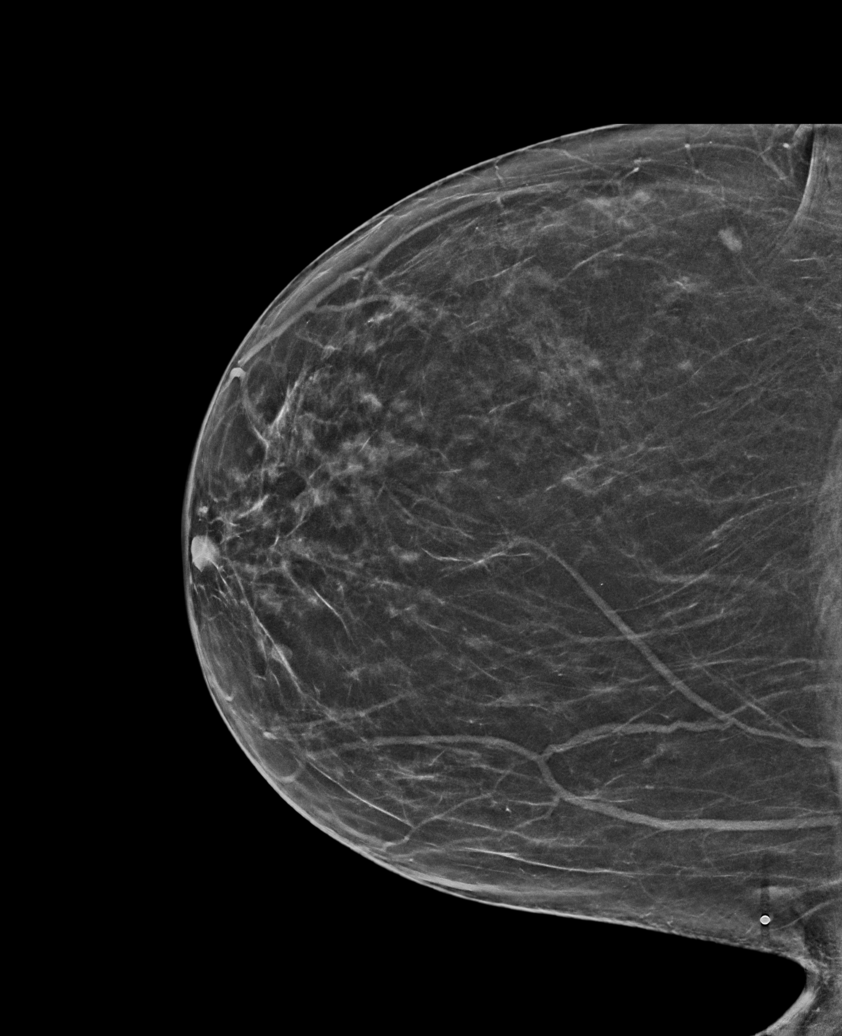

[L MLO synth-2D]
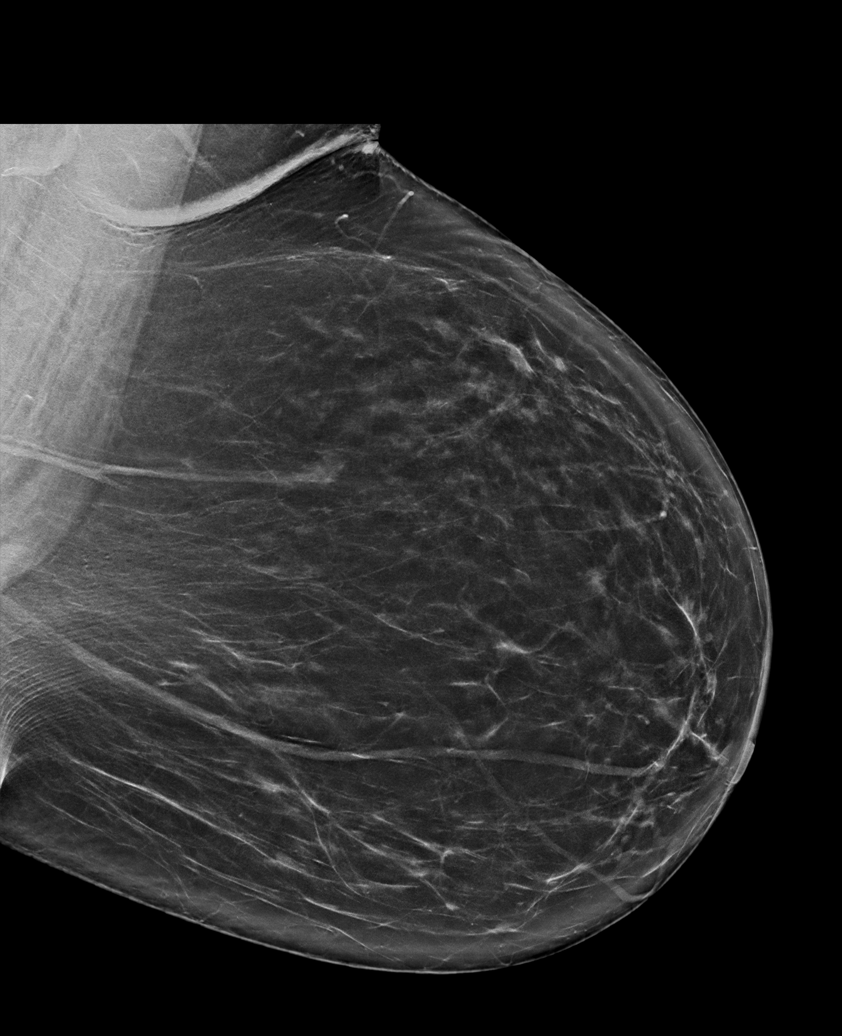

[R MLO tomo · tomo slice 40/79.0]
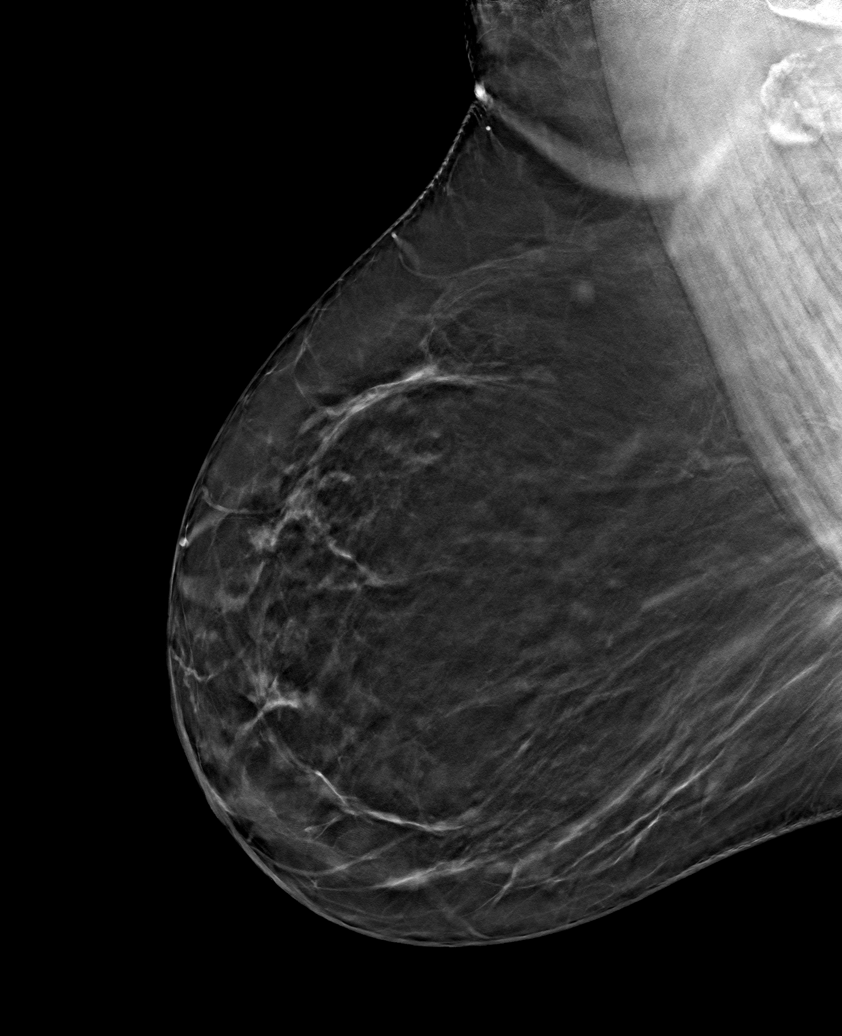

[6 of 30 positions shown; findings below may reference images not displayed]

ACR Breast Density Category b: There are scattered areas of
fibroglandular density.
FINDINGS: No suspicious masses or calcifications are seen in the left breast.
Spot compression tangential tomograms were performed over the
palpable area of concern in the far inner right breast. There is in
oval low density asymmetry seen in the region of palpable concern
measuring approximately 1 cm.

Mammographic images were processed with CAD.

Physical examination at site of palpable concern in the inner right
breast reveals a small area of thickening near midline.

Targeted ultrasound of the right breast was performed. There is a
hypoechoic mass with slight margin irregularity within the skin of
the right breast at 2 o'clock 10 cm from nipple measuring 0.6 x
x 0.6 cm. This may represent a resolving area of infection given
patient's history.
IMPRESSION: Probably benign area of resolving infection site of palpable concern
in the inner right breast following a mosquito bite.

RECOMMENDATION:
Recommend the patient return in approximately 6-8 weeks for
short-term follow-up ultrasound to ensure improvement/resolution of
the probable area of resolving infection in the inner right breast.

I have discussed the findings and recommendations with the patient.
Results were also provided in writing at the conclusion of the
visit. If applicable, a reminder letter will be sent to the patient
regarding the next appointment.

BI-RADS CATEGORY  3: Probably benign.
